# Patient Record
Sex: Male | Born: 2000
Health system: Southern US, Community
[De-identification: ages and names within clinical notes are randomized; demographics above are authoritative.]

---
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
## 2021-11-24 IMAGING — DX DG TIBIA/FIBULA 2V*L*
4 series · 4 of 4 positions shown · non-contrast
Comparison: None.

CLINICAL DATA: Pain just above the ankle

EXAM:
LEFT TIBIA AND FIBULA - 2 VIEW

[tibia ap (1 of 2)]
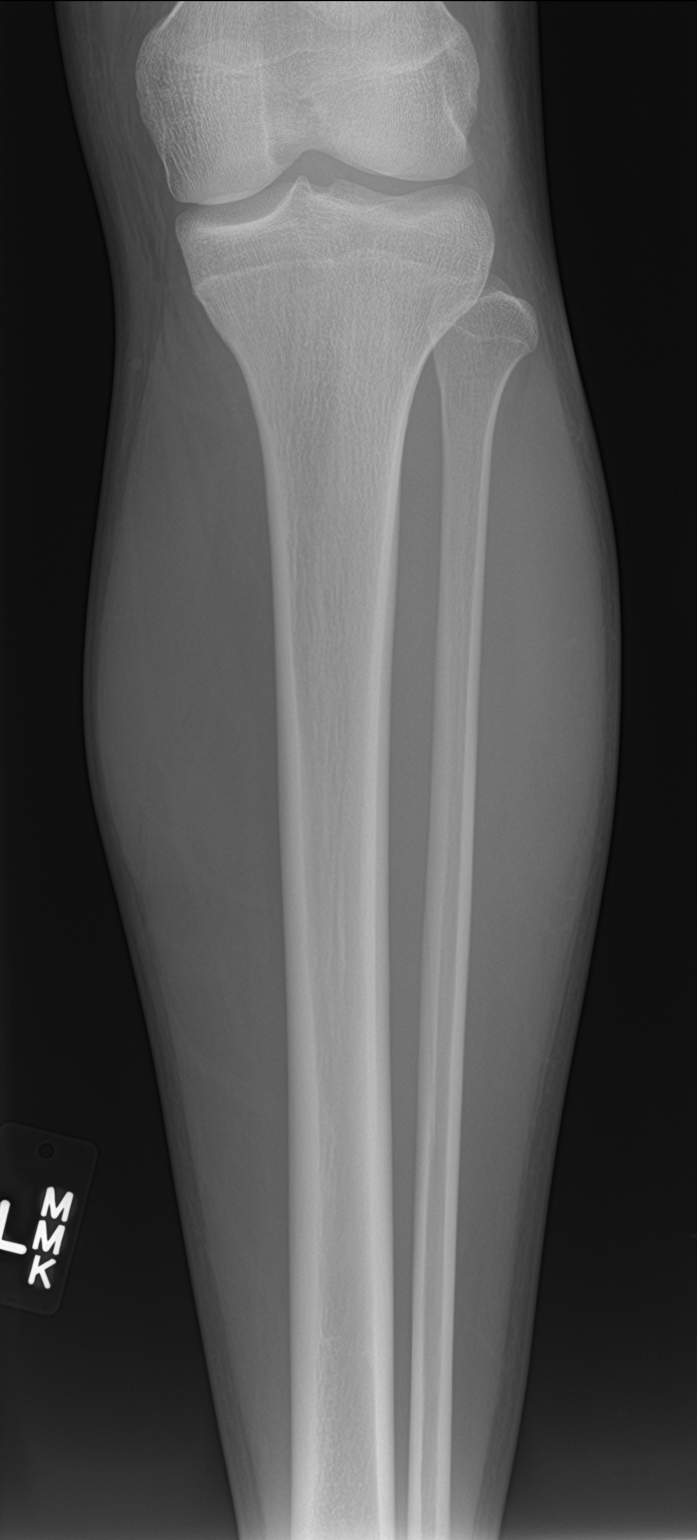

[tibia ap (2 of 2)]
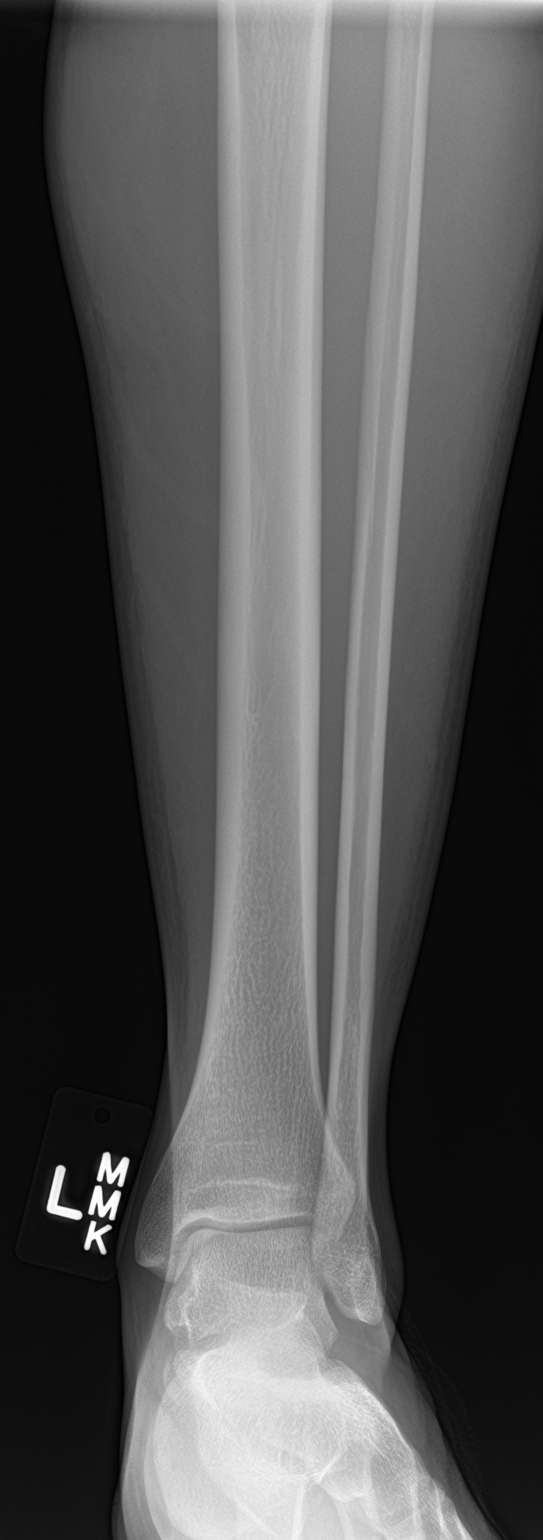

[tibia lat (1 of 2)]
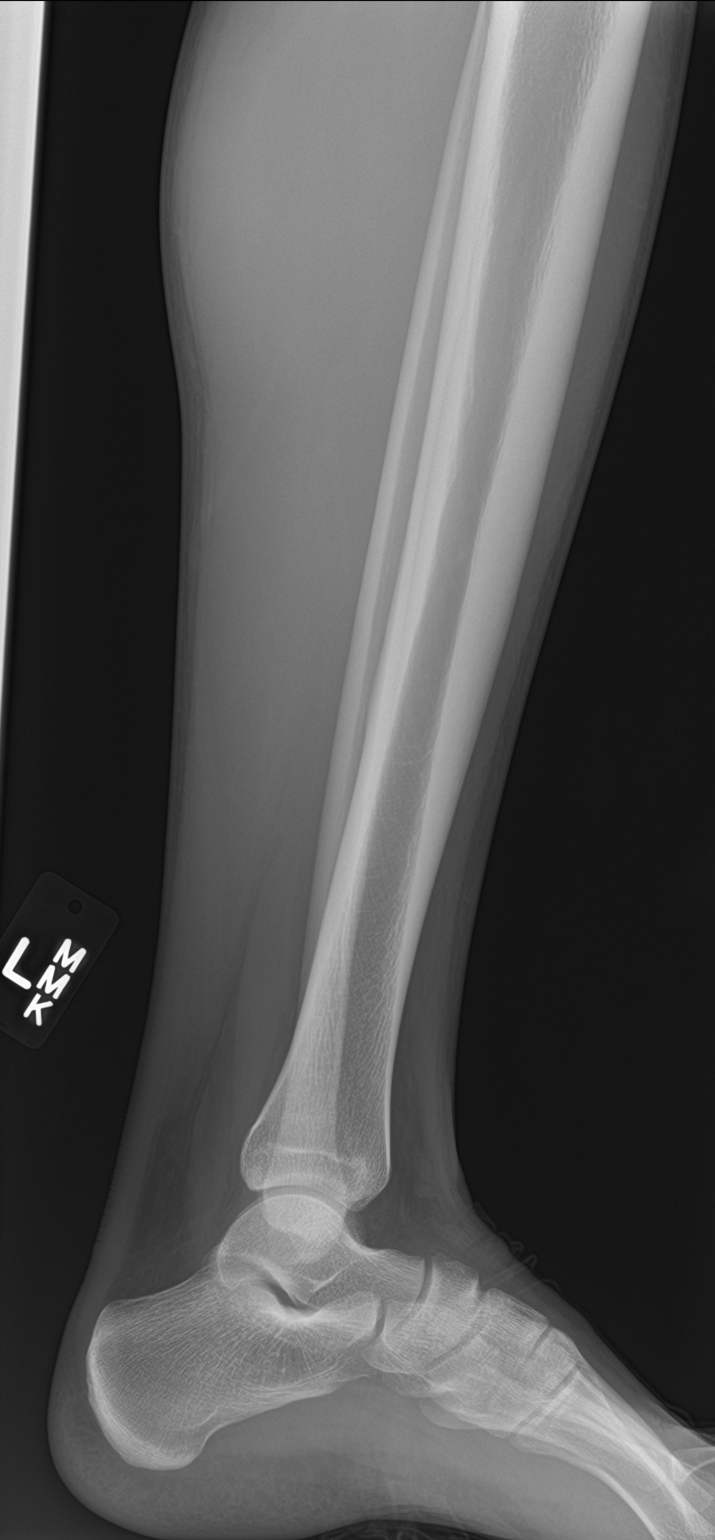

[tibia lat (2 of 2)]
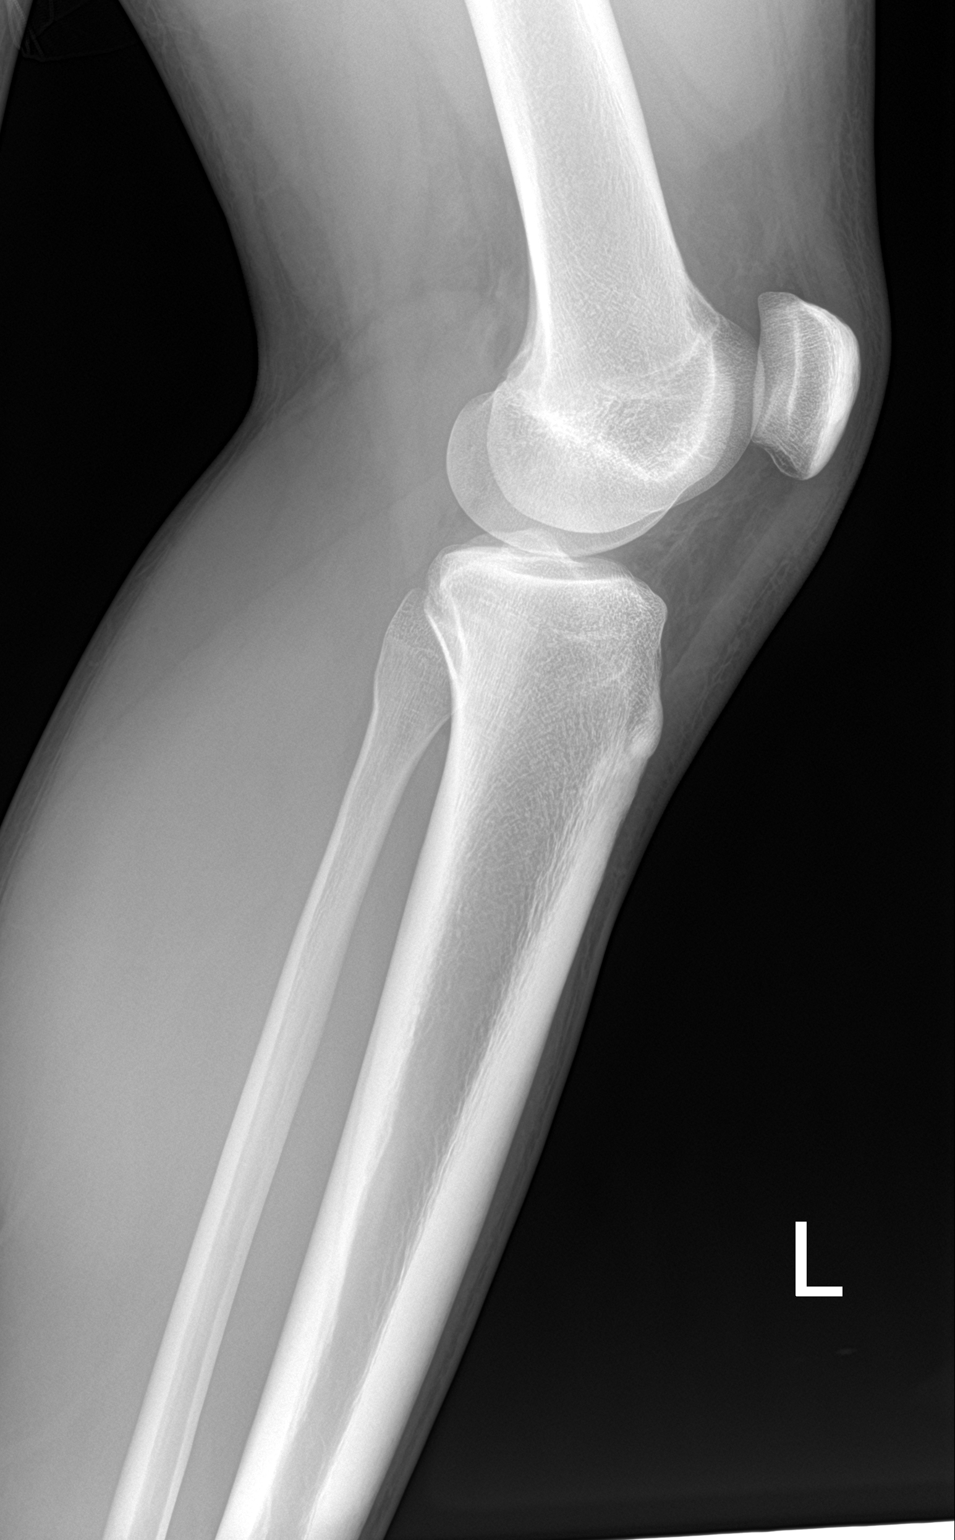

[4 of 4 positions shown; findings below may reference images not displayed]

FINDINGS: There is no evidence of fracture or other focal bone lesions. Soft
tissues are unremarkable.
IMPRESSION: Negative.
